# Patient Record
Sex: Female | Born: 1945 | Race: White | Hispanic: No | Marital: Married | Smoking: Former smoker
Health system: Southern US, Community
[De-identification: ages and names within clinical notes are randomized; demographics above are authoritative.]

## PROBLEM LIST (undated history)

## (undated) DIAGNOSIS — K219 Gastro-esophageal reflux disease without esophagitis: Secondary | ICD-10-CM

## (undated) DIAGNOSIS — I4510 Unspecified right bundle-branch block: Secondary | ICD-10-CM

## (undated) DIAGNOSIS — R413 Other amnesia: Secondary | ICD-10-CM

## (undated) DIAGNOSIS — R609 Edema, unspecified: Secondary | ICD-10-CM

## (undated) DIAGNOSIS — I10 Essential (primary) hypertension: Secondary | ICD-10-CM

## (undated) HISTORY — PX: TUBAL LIGATION: SHX77

## (undated) HISTORY — DX: Gastro-esophageal reflux disease without esophagitis: K21.9

## (undated) HISTORY — DX: Other amnesia: R41.3

## (undated) HISTORY — DX: Essential (primary) hypertension: I10

## (undated) HISTORY — PX: OTHER SURGICAL HISTORY: SHX169

## (undated) HISTORY — DX: Edema, unspecified: R60.9

## (undated) HISTORY — DX: Unspecified right bundle-branch block: I45.10

---

## 2012-12-03 DIAGNOSIS — L509 Urticaria, unspecified: Secondary | ICD-10-CM

## 2012-12-03 HISTORY — DX: Urticaria, unspecified: L50.9

## 2014-09-28 DIAGNOSIS — E119 Type 2 diabetes mellitus without complications: Secondary | ICD-10-CM | POA: Diagnosis not present

## 2014-09-28 DIAGNOSIS — H524 Presbyopia: Secondary | ICD-10-CM | POA: Diagnosis not present

## 2014-09-28 DIAGNOSIS — E11359 Type 2 diabetes mellitus with proliferative diabetic retinopathy without macular edema: Secondary | ICD-10-CM | POA: Diagnosis not present

## 2014-11-22 DIAGNOSIS — E119 Type 2 diabetes mellitus without complications: Secondary | ICD-10-CM | POA: Diagnosis not present

## 2014-12-18 DIAGNOSIS — E119 Type 2 diabetes mellitus without complications: Secondary | ICD-10-CM | POA: Diagnosis not present

## 2014-12-25 DIAGNOSIS — Z1389 Encounter for screening for other disorder: Secondary | ICD-10-CM | POA: Diagnosis not present

## 2014-12-25 DIAGNOSIS — Z0000 Encounter for general adult medical examination without abnormal findings: Secondary | ICD-10-CM | POA: Diagnosis not present

## 2014-12-25 DIAGNOSIS — E119 Type 2 diabetes mellitus without complications: Secondary | ICD-10-CM | POA: Diagnosis not present

## 2014-12-25 DIAGNOSIS — Z9181 History of falling: Secondary | ICD-10-CM | POA: Diagnosis not present

## 2014-12-25 DIAGNOSIS — Z23 Encounter for immunization: Secondary | ICD-10-CM | POA: Diagnosis not present

## 2015-01-08 DIAGNOSIS — M5116 Intervertebral disc disorders with radiculopathy, lumbar region: Secondary | ICD-10-CM | POA: Diagnosis not present

## 2015-01-26 DIAGNOSIS — R5382 Chronic fatigue, unspecified: Secondary | ICD-10-CM | POA: Diagnosis not present

## 2015-01-26 DIAGNOSIS — E780 Pure hypercholesterolemia: Secondary | ICD-10-CM | POA: Diagnosis not present

## 2015-01-26 DIAGNOSIS — E569 Vitamin deficiency, unspecified: Secondary | ICD-10-CM | POA: Diagnosis not present

## 2015-01-26 DIAGNOSIS — R609 Edema, unspecified: Secondary | ICD-10-CM | POA: Diagnosis not present

## 2015-02-25 DIAGNOSIS — E780 Pure hypercholesterolemia: Secondary | ICD-10-CM | POA: Diagnosis not present

## 2015-02-25 DIAGNOSIS — E119 Type 2 diabetes mellitus without complications: Secondary | ICD-10-CM | POA: Diagnosis not present

## 2015-02-25 DIAGNOSIS — E569 Vitamin deficiency, unspecified: Secondary | ICD-10-CM | POA: Diagnosis not present

## 2015-02-25 DIAGNOSIS — I10 Essential (primary) hypertension: Secondary | ICD-10-CM | POA: Diagnosis not present

## 2015-03-19 DIAGNOSIS — E119 Type 2 diabetes mellitus without complications: Secondary | ICD-10-CM | POA: Diagnosis not present

## 2015-03-26 DIAGNOSIS — E119 Type 2 diabetes mellitus without complications: Secondary | ICD-10-CM | POA: Diagnosis not present

## 2015-04-20 DIAGNOSIS — Z23 Encounter for immunization: Secondary | ICD-10-CM | POA: Diagnosis not present

## 2015-07-06 DIAGNOSIS — Z1231 Encounter for screening mammogram for malignant neoplasm of breast: Secondary | ICD-10-CM | POA: Diagnosis not present

## 2015-09-13 DIAGNOSIS — E782 Mixed hyperlipidemia: Secondary | ICD-10-CM | POA: Diagnosis not present

## 2015-09-13 DIAGNOSIS — E119 Type 2 diabetes mellitus without complications: Secondary | ICD-10-CM | POA: Diagnosis not present

## 2015-09-20 DIAGNOSIS — D510 Vitamin B12 deficiency anemia due to intrinsic factor deficiency: Secondary | ICD-10-CM | POA: Diagnosis not present

## 2015-09-20 DIAGNOSIS — M859 Disorder of bone density and structure, unspecified: Secondary | ICD-10-CM | POA: Diagnosis not present

## 2015-09-20 DIAGNOSIS — E559 Vitamin D deficiency, unspecified: Secondary | ICD-10-CM | POA: Diagnosis not present

## 2015-09-20 DIAGNOSIS — D649 Anemia, unspecified: Secondary | ICD-10-CM | POA: Diagnosis not present

## 2015-09-20 DIAGNOSIS — E119 Type 2 diabetes mellitus without complications: Secondary | ICD-10-CM | POA: Diagnosis not present

## 2015-10-01 DIAGNOSIS — E119 Type 2 diabetes mellitus without complications: Secondary | ICD-10-CM | POA: Diagnosis not present

## 2015-10-01 DIAGNOSIS — Z961 Presence of intraocular lens: Secondary | ICD-10-CM | POA: Diagnosis not present

## 2016-03-12 DIAGNOSIS — E119 Type 2 diabetes mellitus without complications: Secondary | ICD-10-CM | POA: Diagnosis not present

## 2016-03-19 DIAGNOSIS — R0609 Other forms of dyspnea: Secondary | ICD-10-CM | POA: Diagnosis not present

## 2016-03-19 DIAGNOSIS — Z9181 History of falling: Secondary | ICD-10-CM | POA: Diagnosis not present

## 2016-03-19 DIAGNOSIS — Z0000 Encounter for general adult medical examination without abnormal findings: Secondary | ICD-10-CM | POA: Diagnosis not present

## 2016-03-19 DIAGNOSIS — E119 Type 2 diabetes mellitus without complications: Secondary | ICD-10-CM | POA: Diagnosis not present

## 2016-03-19 DIAGNOSIS — Z1389 Encounter for screening for other disorder: Secondary | ICD-10-CM | POA: Diagnosis not present

## 2016-04-10 DIAGNOSIS — R0609 Other forms of dyspnea: Secondary | ICD-10-CM | POA: Diagnosis not present

## 2016-04-10 DIAGNOSIS — R0602 Shortness of breath: Secondary | ICD-10-CM | POA: Diagnosis not present

## 2016-04-16 DIAGNOSIS — Z23 Encounter for immunization: Secondary | ICD-10-CM | POA: Diagnosis not present

## 2016-04-16 DIAGNOSIS — Z0000 Encounter for general adult medical examination without abnormal findings: Secondary | ICD-10-CM | POA: Diagnosis not present

## 2016-06-09 DIAGNOSIS — L03116 Cellulitis of left lower limb: Secondary | ICD-10-CM | POA: Diagnosis not present

## 2016-06-09 DIAGNOSIS — L03031 Cellulitis of right toe: Secondary | ICD-10-CM | POA: Diagnosis not present

## 2016-07-30 DIAGNOSIS — L03031 Cellulitis of right toe: Secondary | ICD-10-CM | POA: Diagnosis not present

## 2016-07-30 DIAGNOSIS — M109 Gout, unspecified: Secondary | ICD-10-CM | POA: Diagnosis not present

## 2016-09-10 DIAGNOSIS — E119 Type 2 diabetes mellitus without complications: Secondary | ICD-10-CM | POA: Diagnosis not present

## 2016-09-17 DIAGNOSIS — E119 Type 2 diabetes mellitus without complications: Secondary | ICD-10-CM | POA: Diagnosis not present

## 2016-09-17 DIAGNOSIS — M109 Gout, unspecified: Secondary | ICD-10-CM | POA: Diagnosis not present

## 2016-09-17 DIAGNOSIS — N183 Chronic kidney disease, stage 3 (moderate): Secondary | ICD-10-CM | POA: Diagnosis not present

## 2016-09-22 DIAGNOSIS — Z1231 Encounter for screening mammogram for malignant neoplasm of breast: Secondary | ICD-10-CM | POA: Diagnosis not present

## 2016-10-01 DIAGNOSIS — E119 Type 2 diabetes mellitus without complications: Secondary | ICD-10-CM | POA: Diagnosis not present

## 2017-01-05 DIAGNOSIS — Z79899 Other long term (current) drug therapy: Secondary | ICD-10-CM | POA: Diagnosis not present

## 2017-03-17 DIAGNOSIS — E119 Type 2 diabetes mellitus without complications: Secondary | ICD-10-CM | POA: Diagnosis not present

## 2017-03-25 DIAGNOSIS — Z0000 Encounter for general adult medical examination without abnormal findings: Secondary | ICD-10-CM | POA: Diagnosis not present

## 2017-03-25 DIAGNOSIS — E119 Type 2 diabetes mellitus without complications: Secondary | ICD-10-CM | POA: Diagnosis not present

## 2017-03-25 DIAGNOSIS — Z6832 Body mass index (BMI) 32.0-32.9, adult: Secondary | ICD-10-CM | POA: Diagnosis not present

## 2017-03-25 DIAGNOSIS — Z9181 History of falling: Secondary | ICD-10-CM | POA: Diagnosis not present

## 2017-04-22 DIAGNOSIS — Z23 Encounter for immunization: Secondary | ICD-10-CM | POA: Diagnosis not present

## 2017-09-18 DIAGNOSIS — E119 Type 2 diabetes mellitus without complications: Secondary | ICD-10-CM | POA: Diagnosis not present

## 2017-09-18 DIAGNOSIS — E782 Mixed hyperlipidemia: Secondary | ICD-10-CM | POA: Diagnosis not present

## 2017-09-25 DIAGNOSIS — M25512 Pain in left shoulder: Secondary | ICD-10-CM | POA: Diagnosis not present

## 2017-09-25 DIAGNOSIS — Z1331 Encounter for screening for depression: Secondary | ICD-10-CM | POA: Diagnosis not present

## 2017-09-25 DIAGNOSIS — E119 Type 2 diabetes mellitus without complications: Secondary | ICD-10-CM | POA: Diagnosis not present

## 2017-09-25 DIAGNOSIS — Z6833 Body mass index (BMI) 33.0-33.9, adult: Secondary | ICD-10-CM | POA: Diagnosis not present

## 2017-09-30 DIAGNOSIS — M75102 Unspecified rotator cuff tear or rupture of left shoulder, not specified as traumatic: Secondary | ICD-10-CM | POA: Diagnosis not present

## 2017-10-13 DIAGNOSIS — E119 Type 2 diabetes mellitus without complications: Secondary | ICD-10-CM | POA: Diagnosis not present

## 2017-10-20 DIAGNOSIS — Z1231 Encounter for screening mammogram for malignant neoplasm of breast: Secondary | ICD-10-CM | POA: Diagnosis not present

## 2018-02-08 DIAGNOSIS — Z6832 Body mass index (BMI) 32.0-32.9, adult: Secondary | ICD-10-CM | POA: Diagnosis not present

## 2018-02-08 DIAGNOSIS — R300 Dysuria: Secondary | ICD-10-CM | POA: Diagnosis not present

## 2018-03-19 DIAGNOSIS — E1165 Type 2 diabetes mellitus with hyperglycemia: Secondary | ICD-10-CM | POA: Diagnosis not present

## 2018-03-26 DIAGNOSIS — Z6832 Body mass index (BMI) 32.0-32.9, adult: Secondary | ICD-10-CM | POA: Diagnosis not present

## 2018-03-26 DIAGNOSIS — R350 Frequency of micturition: Secondary | ICD-10-CM | POA: Diagnosis not present

## 2018-03-26 DIAGNOSIS — R0989 Other specified symptoms and signs involving the circulatory and respiratory systems: Secondary | ICD-10-CM | POA: Diagnosis not present

## 2018-03-26 DIAGNOSIS — E118 Type 2 diabetes mellitus with unspecified complications: Secondary | ICD-10-CM | POA: Diagnosis not present

## 2018-04-23 DIAGNOSIS — Z1339 Encounter for screening examination for other mental health and behavioral disorders: Secondary | ICD-10-CM | POA: Diagnosis not present

## 2018-04-23 DIAGNOSIS — Z6833 Body mass index (BMI) 33.0-33.9, adult: Secondary | ICD-10-CM | POA: Diagnosis not present

## 2018-04-23 DIAGNOSIS — Z9181 History of falling: Secondary | ICD-10-CM | POA: Diagnosis not present

## 2018-04-23 DIAGNOSIS — Z23 Encounter for immunization: Secondary | ICD-10-CM | POA: Diagnosis not present

## 2018-04-23 DIAGNOSIS — M79672 Pain in left foot: Secondary | ICD-10-CM | POA: Diagnosis not present

## 2018-05-19 DIAGNOSIS — Z79899 Other long term (current) drug therapy: Secondary | ICD-10-CM | POA: Diagnosis not present

## 2018-06-02 DIAGNOSIS — Z6833 Body mass index (BMI) 33.0-33.9, adult: Secondary | ICD-10-CM | POA: Diagnosis not present

## 2018-06-02 DIAGNOSIS — J40 Bronchitis, not specified as acute or chronic: Secondary | ICD-10-CM | POA: Diagnosis not present

## 2018-06-02 DIAGNOSIS — J329 Chronic sinusitis, unspecified: Secondary | ICD-10-CM | POA: Diagnosis not present

## 2018-09-01 DIAGNOSIS — E118 Type 2 diabetes mellitus with unspecified complications: Secondary | ICD-10-CM | POA: Diagnosis not present

## 2018-09-28 DIAGNOSIS — I10 Essential (primary) hypertension: Secondary | ICD-10-CM | POA: Diagnosis not present

## 2018-09-28 DIAGNOSIS — E118 Type 2 diabetes mellitus with unspecified complications: Secondary | ICD-10-CM | POA: Diagnosis not present

## 2018-09-28 DIAGNOSIS — Z1331 Encounter for screening for depression: Secondary | ICD-10-CM | POA: Diagnosis not present

## 2018-09-28 DIAGNOSIS — Z6833 Body mass index (BMI) 33.0-33.9, adult: Secondary | ICD-10-CM | POA: Diagnosis not present

## 2019-03-21 DIAGNOSIS — E119 Type 2 diabetes mellitus without complications: Secondary | ICD-10-CM | POA: Diagnosis not present

## 2019-03-21 DIAGNOSIS — E782 Mixed hyperlipidemia: Secondary | ICD-10-CM | POA: Diagnosis not present

## 2019-03-28 DIAGNOSIS — E782 Mixed hyperlipidemia: Secondary | ICD-10-CM | POA: Diagnosis not present

## 2019-03-28 DIAGNOSIS — I10 Essential (primary) hypertension: Secondary | ICD-10-CM | POA: Diagnosis not present

## 2019-03-28 DIAGNOSIS — Z13820 Encounter for screening for osteoporosis: Secondary | ICD-10-CM | POA: Diagnosis not present

## 2019-03-28 DIAGNOSIS — R350 Frequency of micturition: Secondary | ICD-10-CM | POA: Diagnosis not present

## 2019-03-28 DIAGNOSIS — E119 Type 2 diabetes mellitus without complications: Secondary | ICD-10-CM | POA: Diagnosis not present

## 2019-03-28 DIAGNOSIS — Z0000 Encounter for general adult medical examination without abnormal findings: Secondary | ICD-10-CM | POA: Diagnosis not present

## 2019-03-28 DIAGNOSIS — E669 Obesity, unspecified: Secondary | ICD-10-CM | POA: Diagnosis not present

## 2019-03-28 DIAGNOSIS — N183 Chronic kidney disease, stage 3 (moderate): Secondary | ICD-10-CM | POA: Diagnosis not present

## 2019-03-28 DIAGNOSIS — M810 Age-related osteoporosis without current pathological fracture: Secondary | ICD-10-CM | POA: Diagnosis not present

## 2019-04-14 DIAGNOSIS — Z23 Encounter for immunization: Secondary | ICD-10-CM | POA: Diagnosis not present

## 2019-04-14 DIAGNOSIS — E669 Obesity, unspecified: Secondary | ICD-10-CM | POA: Diagnosis not present

## 2019-04-14 DIAGNOSIS — E119 Type 2 diabetes mellitus without complications: Secondary | ICD-10-CM | POA: Diagnosis not present

## 2019-04-14 DIAGNOSIS — Z6833 Body mass index (BMI) 33.0-33.9, adult: Secondary | ICD-10-CM | POA: Diagnosis not present

## 2019-04-14 DIAGNOSIS — N3000 Acute cystitis without hematuria: Secondary | ICD-10-CM | POA: Diagnosis not present

## 2019-07-11 DIAGNOSIS — E119 Type 2 diabetes mellitus without complications: Secondary | ICD-10-CM | POA: Diagnosis not present

## 2019-07-14 DIAGNOSIS — Z9181 History of falling: Secondary | ICD-10-CM | POA: Diagnosis not present

## 2019-07-14 DIAGNOSIS — I10 Essential (primary) hypertension: Secondary | ICD-10-CM | POA: Diagnosis not present

## 2019-07-14 DIAGNOSIS — E119 Type 2 diabetes mellitus without complications: Secondary | ICD-10-CM | POA: Diagnosis not present

## 2019-07-14 DIAGNOSIS — Z6834 Body mass index (BMI) 34.0-34.9, adult: Secondary | ICD-10-CM | POA: Diagnosis not present

## 2019-07-14 DIAGNOSIS — M85859 Other specified disorders of bone density and structure, unspecified thigh: Secondary | ICD-10-CM | POA: Diagnosis not present

## 2019-07-14 DIAGNOSIS — E669 Obesity, unspecified: Secondary | ICD-10-CM | POA: Diagnosis not present

## 2019-07-21 DIAGNOSIS — E119 Type 2 diabetes mellitus without complications: Secondary | ICD-10-CM | POA: Diagnosis not present

## 2019-07-21 DIAGNOSIS — Z135 Encounter for screening for eye and ear disorders: Secondary | ICD-10-CM | POA: Diagnosis not present

## 2019-07-21 DIAGNOSIS — Z79899 Other long term (current) drug therapy: Secondary | ICD-10-CM | POA: Diagnosis not present

## 2019-07-21 DIAGNOSIS — Z961 Presence of intraocular lens: Secondary | ICD-10-CM | POA: Diagnosis not present

## 2019-07-21 DIAGNOSIS — H524 Presbyopia: Secondary | ICD-10-CM | POA: Diagnosis not present

## 2019-07-21 DIAGNOSIS — Z0100 Encounter for examination of eyes and vision without abnormal findings: Secondary | ICD-10-CM | POA: Diagnosis not present

## 2019-12-27 DIAGNOSIS — M25475 Effusion, left foot: Secondary | ICD-10-CM | POA: Diagnosis not present

## 2019-12-27 DIAGNOSIS — E119 Type 2 diabetes mellitus without complications: Secondary | ICD-10-CM | POA: Diagnosis not present

## 2019-12-27 DIAGNOSIS — Z6834 Body mass index (BMI) 34.0-34.9, adult: Secondary | ICD-10-CM | POA: Diagnosis not present

## 2019-12-27 DIAGNOSIS — M109 Gout, unspecified: Secondary | ICD-10-CM | POA: Diagnosis not present

## 2019-12-27 DIAGNOSIS — M7989 Other specified soft tissue disorders: Secondary | ICD-10-CM | POA: Diagnosis not present

## 2020-01-05 DIAGNOSIS — E782 Mixed hyperlipidemia: Secondary | ICD-10-CM | POA: Diagnosis not present

## 2020-01-12 DIAGNOSIS — Z6833 Body mass index (BMI) 33.0-33.9, adult: Secondary | ICD-10-CM | POA: Diagnosis not present

## 2020-01-12 DIAGNOSIS — E669 Obesity, unspecified: Secondary | ICD-10-CM | POA: Diagnosis not present

## 2020-01-12 DIAGNOSIS — E1169 Type 2 diabetes mellitus with other specified complication: Secondary | ICD-10-CM | POA: Diagnosis not present

## 2020-01-12 DIAGNOSIS — M109 Gout, unspecified: Secondary | ICD-10-CM | POA: Diagnosis not present

## 2020-01-12 DIAGNOSIS — E118 Type 2 diabetes mellitus with unspecified complications: Secondary | ICD-10-CM | POA: Diagnosis not present

## 2020-03-30 DIAGNOSIS — E1169 Type 2 diabetes mellitus with other specified complication: Secondary | ICD-10-CM | POA: Diagnosis not present

## 2020-03-30 DIAGNOSIS — Z1331 Encounter for screening for depression: Secondary | ICD-10-CM | POA: Diagnosis not present

## 2020-03-30 DIAGNOSIS — E118 Type 2 diabetes mellitus with unspecified complications: Secondary | ICD-10-CM | POA: Diagnosis not present

## 2020-03-30 DIAGNOSIS — Z6833 Body mass index (BMI) 33.0-33.9, adult: Secondary | ICD-10-CM | POA: Diagnosis not present

## 2020-03-30 DIAGNOSIS — E782 Mixed hyperlipidemia: Secondary | ICD-10-CM | POA: Diagnosis not present

## 2020-03-30 DIAGNOSIS — Z0000 Encounter for general adult medical examination without abnormal findings: Secondary | ICD-10-CM | POA: Diagnosis not present

## 2020-03-30 DIAGNOSIS — Z79899 Other long term (current) drug therapy: Secondary | ICD-10-CM | POA: Diagnosis not present

## 2020-03-30 DIAGNOSIS — I10 Essential (primary) hypertension: Secondary | ICD-10-CM | POA: Diagnosis not present

## 2020-04-13 DIAGNOSIS — Z1231 Encounter for screening mammogram for malignant neoplasm of breast: Secondary | ICD-10-CM | POA: Diagnosis not present

## 2020-05-10 DIAGNOSIS — Z23 Encounter for immunization: Secondary | ICD-10-CM | POA: Diagnosis not present

## 2020-08-14 DIAGNOSIS — Z23 Encounter for immunization: Secondary | ICD-10-CM | POA: Diagnosis not present

## 2020-09-24 DIAGNOSIS — E118 Type 2 diabetes mellitus with unspecified complications: Secondary | ICD-10-CM | POA: Diagnosis not present

## 2020-10-01 DIAGNOSIS — I10 Essential (primary) hypertension: Secondary | ICD-10-CM | POA: Diagnosis not present

## 2020-10-01 DIAGNOSIS — Z9181 History of falling: Secondary | ICD-10-CM | POA: Diagnosis not present

## 2020-10-01 DIAGNOSIS — E785 Hyperlipidemia, unspecified: Secondary | ICD-10-CM | POA: Diagnosis not present

## 2020-10-01 DIAGNOSIS — Z6833 Body mass index (BMI) 33.0-33.9, adult: Secondary | ICD-10-CM | POA: Diagnosis not present

## 2020-10-01 DIAGNOSIS — H65192 Other acute nonsuppurative otitis media, left ear: Secondary | ICD-10-CM | POA: Diagnosis not present

## 2020-10-01 DIAGNOSIS — N183 Chronic kidney disease, stage 3 unspecified: Secondary | ICD-10-CM | POA: Diagnosis not present

## 2020-10-01 DIAGNOSIS — E118 Type 2 diabetes mellitus with unspecified complications: Secondary | ICD-10-CM | POA: Diagnosis not present

## 2020-10-01 DIAGNOSIS — Z1331 Encounter for screening for depression: Secondary | ICD-10-CM | POA: Diagnosis not present

## 2020-10-26 DIAGNOSIS — Z0100 Encounter for examination of eyes and vision without abnormal findings: Secondary | ICD-10-CM | POA: Diagnosis not present

## 2020-10-26 DIAGNOSIS — Z135 Encounter for screening for eye and ear disorders: Secondary | ICD-10-CM | POA: Diagnosis not present

## 2020-10-26 DIAGNOSIS — E119 Type 2 diabetes mellitus without complications: Secondary | ICD-10-CM | POA: Diagnosis not present

## 2020-10-26 DIAGNOSIS — Z961 Presence of intraocular lens: Secondary | ICD-10-CM | POA: Diagnosis not present

## 2020-12-20 DIAGNOSIS — H18413 Arcus senilis, bilateral: Secondary | ICD-10-CM | POA: Diagnosis not present

## 2020-12-20 DIAGNOSIS — H26492 Other secondary cataract, left eye: Secondary | ICD-10-CM | POA: Diagnosis not present

## 2020-12-20 DIAGNOSIS — H02831 Dermatochalasis of right upper eyelid: Secondary | ICD-10-CM | POA: Diagnosis not present

## 2020-12-20 DIAGNOSIS — Z961 Presence of intraocular lens: Secondary | ICD-10-CM | POA: Diagnosis not present

## 2021-03-25 DIAGNOSIS — Z0000 Encounter for general adult medical examination without abnormal findings: Secondary | ICD-10-CM | POA: Diagnosis not present

## 2021-03-25 DIAGNOSIS — E785 Hyperlipidemia, unspecified: Secondary | ICD-10-CM | POA: Diagnosis not present

## 2021-04-01 DIAGNOSIS — Z79899 Other long term (current) drug therapy: Secondary | ICD-10-CM | POA: Diagnosis not present

## 2021-04-01 DIAGNOSIS — Z13820 Encounter for screening for osteoporosis: Secondary | ICD-10-CM | POA: Diagnosis not present

## 2021-04-01 DIAGNOSIS — D692 Other nonthrombocytopenic purpura: Secondary | ICD-10-CM | POA: Diagnosis not present

## 2021-04-01 DIAGNOSIS — N183 Chronic kidney disease, stage 3 unspecified: Secondary | ICD-10-CM | POA: Diagnosis not present

## 2021-04-01 DIAGNOSIS — Z6832 Body mass index (BMI) 32.0-32.9, adult: Secondary | ICD-10-CM | POA: Diagnosis not present

## 2021-04-01 DIAGNOSIS — E118 Type 2 diabetes mellitus with unspecified complications: Secondary | ICD-10-CM | POA: Diagnosis not present

## 2021-04-01 DIAGNOSIS — I10 Essential (primary) hypertension: Secondary | ICD-10-CM | POA: Diagnosis not present

## 2021-04-01 DIAGNOSIS — Z0000 Encounter for general adult medical examination without abnormal findings: Secondary | ICD-10-CM | POA: Diagnosis not present

## 2021-04-19 DIAGNOSIS — Z23 Encounter for immunization: Secondary | ICD-10-CM | POA: Diagnosis not present

## 2021-05-21 DIAGNOSIS — Z1231 Encounter for screening mammogram for malignant neoplasm of breast: Secondary | ICD-10-CM | POA: Diagnosis not present

## 2021-10-01 DIAGNOSIS — S8992XA Unspecified injury of left lower leg, initial encounter: Secondary | ICD-10-CM | POA: Diagnosis not present

## 2021-10-01 DIAGNOSIS — Z6834 Body mass index (BMI) 34.0-34.9, adult: Secondary | ICD-10-CM | POA: Diagnosis not present

## 2021-10-01 DIAGNOSIS — S4992XA Unspecified injury of left shoulder and upper arm, initial encounter: Secondary | ICD-10-CM | POA: Diagnosis not present

## 2021-10-01 DIAGNOSIS — E118 Type 2 diabetes mellitus with unspecified complications: Secondary | ICD-10-CM | POA: Diagnosis not present

## 2021-10-01 DIAGNOSIS — S6992XA Unspecified injury of left wrist, hand and finger(s), initial encounter: Secondary | ICD-10-CM | POA: Diagnosis not present

## 2021-10-01 DIAGNOSIS — D692 Other nonthrombocytopenic purpura: Secondary | ICD-10-CM | POA: Diagnosis not present

## 2021-10-01 DIAGNOSIS — N183 Chronic kidney disease, stage 3 unspecified: Secondary | ICD-10-CM | POA: Diagnosis not present

## 2021-10-01 DIAGNOSIS — I10 Essential (primary) hypertension: Secondary | ICD-10-CM | POA: Diagnosis not present

## 2021-10-11 ENCOUNTER — Ambulatory Visit (HOSPITAL_BASED_OUTPATIENT_CLINIC_OR_DEPARTMENT_OTHER)
Admission: RE | Admit: 2021-10-11 | Discharge: 2021-10-11 | Disposition: A | Discharge status: Home / Self Care | Payer: Medicare HMO | Source: Ambulatory Visit | Attending: Family Medicine | Admitting: Family Medicine

## 2021-10-11 ENCOUNTER — Other Ambulatory Visit (HOSPITAL_BASED_OUTPATIENT_CLINIC_OR_DEPARTMENT_OTHER): Payer: Self-pay | Admitting: Family Medicine

## 2021-10-11 ENCOUNTER — Other Ambulatory Visit: Payer: Self-pay

## 2021-10-11 DIAGNOSIS — K8020 Calculus of gallbladder without cholecystitis without obstruction: Secondary | ICD-10-CM | POA: Diagnosis not present

## 2021-10-11 DIAGNOSIS — R1011 Right upper quadrant pain: Secondary | ICD-10-CM

## 2021-10-11 DIAGNOSIS — Z6834 Body mass index (BMI) 34.0-34.9, adult: Secondary | ICD-10-CM | POA: Diagnosis not present

## 2021-10-11 DIAGNOSIS — K838 Other specified diseases of biliary tract: Secondary | ICD-10-CM | POA: Diagnosis not present

## 2021-10-14 DIAGNOSIS — K8010 Calculus of gallbladder with chronic cholecystitis without obstruction: Secondary | ICD-10-CM

## 2021-10-14 DIAGNOSIS — E118 Type 2 diabetes mellitus with unspecified complications: Secondary | ICD-10-CM | POA: Diagnosis not present

## 2021-10-14 DIAGNOSIS — Z6833 Body mass index (BMI) 33.0-33.9, adult: Secondary | ICD-10-CM

## 2021-10-14 DIAGNOSIS — R1011 Right upper quadrant pain: Secondary | ICD-10-CM | POA: Diagnosis not present

## 2021-10-14 DIAGNOSIS — Z1331 Encounter for screening for depression: Secondary | ICD-10-CM | POA: Diagnosis not present

## 2021-10-14 HISTORY — DX: Calculus of gallbladder with chronic cholecystitis without obstruction: K80.10

## 2021-10-14 HISTORY — DX: Body mass index (BMI) 33.0-33.9, adult: Z68.33

## 2021-10-16 DIAGNOSIS — I10 Essential (primary) hypertension: Secondary | ICD-10-CM | POA: Diagnosis not present

## 2021-10-16 DIAGNOSIS — E7800 Pure hypercholesterolemia, unspecified: Secondary | ICD-10-CM | POA: Diagnosis not present

## 2021-10-16 DIAGNOSIS — E119 Type 2 diabetes mellitus without complications: Secondary | ICD-10-CM | POA: Diagnosis not present

## 2021-10-16 DIAGNOSIS — K8010 Calculus of gallbladder with chronic cholecystitis without obstruction: Secondary | ICD-10-CM | POA: Diagnosis not present

## 2021-10-16 DIAGNOSIS — Z87891 Personal history of nicotine dependence: Secondary | ICD-10-CM | POA: Diagnosis not present

## 2021-10-16 DIAGNOSIS — Z7984 Long term (current) use of oral hypoglycemic drugs: Secondary | ICD-10-CM | POA: Diagnosis not present

## 2021-10-16 DIAGNOSIS — K219 Gastro-esophageal reflux disease without esophagitis: Secondary | ICD-10-CM | POA: Diagnosis not present

## 2021-10-16 DIAGNOSIS — K8020 Calculus of gallbladder without cholecystitis without obstruction: Secondary | ICD-10-CM | POA: Diagnosis not present

## 2021-10-16 HISTORY — PX: CHOLECYSTECTOMY: SHX55

## 2021-10-23 DIAGNOSIS — G8929 Other chronic pain: Secondary | ICD-10-CM | POA: Diagnosis not present

## 2021-10-23 DIAGNOSIS — Z9049 Acquired absence of other specified parts of digestive tract: Secondary | ICD-10-CM | POA: Diagnosis not present

## 2021-10-23 DIAGNOSIS — Z6833 Body mass index (BMI) 33.0-33.9, adult: Secondary | ICD-10-CM | POA: Diagnosis not present

## 2021-10-23 DIAGNOSIS — M25532 Pain in left wrist: Secondary | ICD-10-CM | POA: Diagnosis not present

## 2021-11-22 DIAGNOSIS — Z9049 Acquired absence of other specified parts of digestive tract: Secondary | ICD-10-CM | POA: Diagnosis not present

## 2021-11-22 DIAGNOSIS — G8929 Other chronic pain: Secondary | ICD-10-CM | POA: Diagnosis not present

## 2021-11-22 DIAGNOSIS — R0789 Other chest pain: Secondary | ICD-10-CM | POA: Diagnosis not present

## 2021-11-22 DIAGNOSIS — Z6833 Body mass index (BMI) 33.0-33.9, adult: Secondary | ICD-10-CM | POA: Diagnosis not present

## 2021-11-22 DIAGNOSIS — K219 Gastro-esophageal reflux disease without esophagitis: Secondary | ICD-10-CM | POA: Diagnosis not present

## 2021-11-22 DIAGNOSIS — M25532 Pain in left wrist: Secondary | ICD-10-CM | POA: Diagnosis not present

## 2021-11-22 DIAGNOSIS — I4510 Unspecified right bundle-branch block: Secondary | ICD-10-CM | POA: Diagnosis not present

## 2021-11-26 ENCOUNTER — Encounter: Payer: Self-pay | Admitting: *Deleted

## 2021-11-26 ENCOUNTER — Encounter: Payer: Self-pay | Admitting: Cardiology

## 2021-11-26 DIAGNOSIS — R609 Edema, unspecified: Secondary | ICD-10-CM | POA: Insufficient documentation

## 2021-11-26 DIAGNOSIS — E119 Type 2 diabetes mellitus without complications: Secondary | ICD-10-CM | POA: Insufficient documentation

## 2021-11-26 DIAGNOSIS — K219 Gastro-esophageal reflux disease without esophagitis: Secondary | ICD-10-CM | POA: Insufficient documentation

## 2021-11-26 DIAGNOSIS — I4510 Unspecified right bundle-branch block: Secondary | ICD-10-CM | POA: Insufficient documentation

## 2021-11-26 DIAGNOSIS — R413 Other amnesia: Secondary | ICD-10-CM | POA: Insufficient documentation

## 2021-11-26 DIAGNOSIS — I10 Essential (primary) hypertension: Secondary | ICD-10-CM | POA: Insufficient documentation

## 2021-11-26 HISTORY — DX: Type 2 diabetes mellitus without complications: E11.9

## 2021-11-27 ENCOUNTER — Ambulatory Visit: Payer: Medicare HMO | Admitting: Cardiology

## 2021-11-27 ENCOUNTER — Encounter: Payer: Self-pay | Admitting: Cardiology

## 2021-11-27 VITALS — BP 207/72 | HR 71 | Ht 63.6 in | Wt 189.0 lb

## 2021-11-27 DIAGNOSIS — I4510 Unspecified right bundle-branch block: Secondary | ICD-10-CM | POA: Diagnosis not present

## 2021-11-27 DIAGNOSIS — E088 Diabetes mellitus due to underlying condition with unspecified complications: Secondary | ICD-10-CM

## 2021-11-27 DIAGNOSIS — R079 Chest pain, unspecified: Secondary | ICD-10-CM

## 2021-11-27 DIAGNOSIS — E669 Obesity, unspecified: Secondary | ICD-10-CM | POA: Insufficient documentation

## 2021-11-27 DIAGNOSIS — E66811 Obesity, class 1: Secondary | ICD-10-CM

## 2021-11-27 DIAGNOSIS — I10 Essential (primary) hypertension: Secondary | ICD-10-CM | POA: Diagnosis not present

## 2021-11-27 HISTORY — DX: Obesity, unspecified: E66.9

## 2021-11-27 HISTORY — DX: Chest pain, unspecified: R07.9

## 2021-11-27 HISTORY — DX: Diabetes mellitus due to underlying condition with unspecified complications: E08.8

## 2021-11-27 HISTORY — DX: Obesity, class 1: E66.811

## 2021-11-27 NOTE — Progress Notes (Signed)
?Cardiology Office Note:   ? ?Date:  11/27/2021  ? ?IDYen Austin, DOB [birth year], MRN [id number] ? ?PCP:  Buckner Malta, MD  ?Cardiologist:  Garwin Brothers, MD  ? ?Referring MD: Buckner Malta, MD  ? ? ?ASSESSMENT:   ? ?1. RBBB (right bundle branch block)   ?2. Primary hypertension   ?3. Diabetes mellitus due to underlying condition with unspecified complications (HCC)   ?4. Chest pain of uncertain etiology   ? ?PLAN:   ? ?In order of problems listed above: ? ?Primary prevention stressed with the patient.  Importance of compliance with diet and medication stressed and she vocalized understanding. ?Chest pain: Unclear etiology.  Overall she leads a sedentary lifestyle.  She is agreeable for an exercise stress Cardiolite.  She has multiple risk factors for coronary artery disease. ?Cardiac murmur: Echocardiogram will be done to assess murmur heard on auscultation. ?Essential hypertension: Patient has an element of whitecoat hypertension.  She checks her blood pressure at home and mentioned to me good numbers that were reliable.  She was advised to continue monitoring blood pressure at home and bring those along the next time she comes to see [place]. ?Obesity: Weight reduction stressed diet emphasized and she promises to do better. ?Patient will be seen in follow-up appointment in 6 months or earlier if the patient has any concerns.  She knows to go to the nearest emergency room for any concerning symptoms. ? ? ?Medication Adjustments/Labs and Tests Ordered: ?Current medicines are reviewed at length with the patient today.  Concerns regarding medicines are outlined above.  ?No orders of the defined types were placed in this encounter. ? ?No orders of the defined types were placed in this encounter. ? ? ? ?History of Present Illness:   ? ?Jacqueline Austin is a 76 y.o. female who is being seen today for the evaluation of chest pain at the request of Buckner Malta, MD. patient is a pleasant 76 year old female.   She has past medical history of essential hypertension, diabetes mellitus and obesity.  Overall she states that she is in good health.  She mentions to me that she has some chest discomfort on exertion.  Occasionally it radiates to the back.  This occurs on exertion.  No orthopnea or PND.  At the time of my evaluation, the patient is alert awake oriented and in no distress. ? ?Past Medical History:  ?Diagnosis Date  ? BMI 33.0-33.9,adult 10/14/2021  ? Calculus of gallbladder with chronic cholecystitis without obstruction 10/14/2021  ? Diabetes mellitus (HCC) 11/26/2021  ? GERD (gastroesophageal reflux disease)   ? Hypertension   ? Memory loss   ? RBBB (right bundle branch block)   ? Swelling   ? Urticaria 12/03/2012  ? ? ?Past Surgical History:  ?Procedure Laterality Date  ? cataract surgery    ? CHOLECYSTECTOMY  10/16/2021  ? Dr. Georgiana Shore  ? TUBAL LIGATION    ? ? ?Current Medications: ?Current Meds  ?Medication Sig  ? amLODipine (NORVASC) 5 MG tablet Take 5 mg by mouth daily.  ? Cholecalciferol (VITAMIN D) 125 MCG (5000 UT) CAPS Take 5,000 Units by mouth daily.  ? famotidine (PEPCID) 20 MG tablet Take 20 mg by mouth daily.  ? metoprolol succinate (TOPROL-XL) 25 MG 24 hr tablet Take 25 mg by mouth daily.  ? Omega-3 Fatty Acids (FISH OIL PO) Take 1 tablet by mouth daily.  ? pioglitazone (ACTOS) 30 MG tablet Take 30 mg by mouth daily.  ? pravastatin (PRAVACHOL) 40 MG tablet  Take 40 mg by mouth daily.  ? sitaGLIPtin (JANUVIA) 100 MG tablet Take 100 mg by mouth daily.  ? telmisartan (MICARDIS) 80 MG tablet Take 80 mg by mouth daily.  ?  ? ?Allergies:   Ace inhibitors, Lisinopril, and Penicillins  ? ?Social History  ? ?Socioeconomic History  ? Marital status: Married  ?  Spouse name: Not on file  ? Number of children: Not on file  ? Years of education: Not on file  ? Highest education level: Not on file  ?Occupational History  ? Not on file  ?Tobacco Use  ? Smoking status: Former  ?  Packs/day: 0.50  ?  Years: 38.00  ?   Pack years: 19.00  ?  Types: Cigarettes  ?  Quit date: 01/1999  ?  Years since quitting: 22.9  ? Smokeless tobacco: Not on file  ?Substance and Sexual Activity  ? Alcohol use: No  ? Drug use: Not on file  ? Sexual activity: Not on file  ?Other Topics Concern  ? Not on file  ?Social History Narrative  ? Not on file  ? ?Social Determinants of Health  ? ?Financial Resource Strain: Not on file  ?Food Insecurity: Not on file  ?Transportation Needs: Not on file  ?Physical Activity: Not on file  ?Stress: Not on file  ?Social Connections: Not on file  ?  ? ?Family History: ?The patient's family history includes Emphysema in her mother; Heart attack in her father; Hypertension in her mother; Stroke in her father; Sudden death in her mother. ? ?ROS:   ?Please see the history of present illness.    ?All other systems reviewed and are negative. ? ?EKGs/Labs/Other Studies Reviewed:   ? ?The following studies were reviewed today: ?EKG reveals sinus rhythm and nonspecific ST-T changes ? ? ?Recent Labs: ?No results found for requested labs within last 8760 hours.  ?Recent Lipid Panel ?No results found for: CHOL, TRIG, HDL, CHOLHDL, VLDL, LDLCALC, LDLDIRECT ? ?Physical Exam:   ? ?VS:  BP (!) 207/72   Pulse 71   Ht 5' 3.6" (1.615 m)   Wt 189 lb (85.7 kg)   SpO2 95%   BMI 32.85 kg/m?    ? ?Wt Readings from Last 3 Encounters:  ?11/27/21 189 lb (85.7 kg)  ?11/22/21 189 lb 6.4 oz (85.9 kg)  ?12/25/10 170 lb (77.1 kg)  ?  ? ?GEN: Patient is in no acute distress ?HEENT: Normal ?NECK: No JVD; No carotid bruits ?LYMPHATICS: No lymphadenopathy ?CARDIAC: S1 S2 regular, 2/6 systolic murmur at the apex. ?RESPIRATORY:  Clear to auscultation without rales, wheezing or rhonchi  ?ABDOMEN: Soft, non-tender, non-distended ?MUSCULOSKELETAL:  No edema; No deformity  ?SKIN: Warm and dry ?NEUROLOGIC:  Alert and oriented x 3 ?PSYCHIATRIC:  Normal affect  ? ? ?Signed, ?Garwin Brothers, MD  ?11/27/2021 2:08 PM    ?[facility]   ?

## 2021-11-27 NOTE — Patient Instructions (Signed)
Medication Instructions:  ?Your physician recommends that you continue on your current medications as directed. Please refer to the Current Medication list given to you today. ? ?*If you need a refill on your cardiac medications before your next appointment, please call your pharmacy* ? ? ?Lab Work: ?None ordered ?If you have labs (blood work) drawn today and your tests are completely normal, you will receive your results only by: ?MyChart Message (if you have MyChart) OR ?A paper copy in the mail ?If you have any lab test that is abnormal or we need to change your treatment, we will call you to review the results. ? ? ?Testing/Procedures: ?Your physician has requested that you have a lexiscan myoview. For further information please visit [email or web address]. Please follow instruction sheet, as given. ? ?The test will take approximately 3 to 4 hours to complete; you may bring reading material.  If someone comes with you to your appointment, they will need to remain in the main lobby due to limited space in the testing area. **If you are pregnant or breastfeeding, please notify the nuclear lab prior to your appointment** ? ?How to prepare for your Myocardial Perfusion Test: ?Do not eat or drink 3 hours prior to your test, except you may have water. ?Do not consume products containing caffeine (regular or decaffeinated) 12 hours prior to your test. (ex: coffee, chocolate, sodas, tea). ?Do bring a list of your current medications with you.  If not listed below, you may take your medications as normal. ?Do not take metoprolol (Lopressor, Toprol) for 24 hours prior to the test.  Bring the medication to your appointment as you may be required to take it once the test is complete. ?Do wear comfortable clothes (no dresses or overalls) and walking shoes, tennis shoes preferred (No heels or open toe shoes are allowed). ?Do NOT wear cologne, perfume, aftershave, or lotions (deodorant is allowed). ?If these instructions are not  followed, your test will have to be rescheduled. ? ?Your physician has requested that you have an echocardiogram. Echocardiography is a painless test that uses sound waves to create images of your heart. It provides your doctor with information about the size and shape of your heart and how well your heart?s chambers and valves are working. This procedure takes approximately one hour. There are no restrictions for this procedure. ? ? ?Follow-Up: ?At [facility], you and your health needs are our priority.  As part of our continuing mission to provide you with exceptional heart care, we have created designated Provider Care Teams.  These Care Teams include your primary Cardiologist (physician) and Advanced Practice Providers (APPs -  Physician Assistants and Nurse Practitioners) who all work together to provide you with the care you need, when you need it. ? ?We recommend signing up for the patient portal called "MyChart".  Sign up information is provided on this After Visit Summary.  MyChart is used to connect with patients for Virtual Visits (Telemedicine).  Patients are able to view lab/test results, encounter notes, upcoming appointments, etc.  Non-urgent messages can be sent to your provider as well.   ?To learn more about what you can do with MyChart, go to [email or web address].   ? ?Your next appointment:   ?6 month(s) ? ?The format for your next appointment:   ?In Person ? ?Provider:   ?Belva Cromeajan Revankar, MD ? ? ?Other Instructions ?Cardiac Nuclear Scan ?A cardiac nuclear scan is a test that is done to check the flow of blood to your heart. It  is done when you are resting and when you are exercising. The test looks for problems such as: ?Not enough blood reaching a portion of the heart. ?The heart muscle not working as it should. ?You may need this test if: ?You have heart disease. ?You have had lab results that are not normal. ?You have had heart surgery or a balloon procedure to open up blocked arteries  (angioplasty). ?You have chest pain. ?You have shortness of breath. ?In this test, a special dye (tracer) is put into your bloodstream. The tracer will travel to your heart. A camera will then take pictures of your heart to see how the tracer moves through your heart. This test is usually done at a hospital and takes 2-4 hours. ?Tell a doctor about: ?Any allergies you have. ?All medicines you are taking, including vitamins, herbs, eye drops, creams, and over-the-counter medicines. ?Any problems you or family members have had with anesthetic medicines. ?Any blood disorders you have. ?Any surgeries you have had. ?Any medical conditions you have. ?Whether you are pregnant or may be pregnant. ?What are the risks? ?Generally, this is a safe test. However, problems may occur, such as: ?Serious chest pain and heart attack. This is only a risk if the stress portion of the test is done. ?Rapid heartbeat. ?A feeling of warmth in your chest. This feeling usually does not last long. ?Allergic reaction to the tracer. ?What happens before the test? ?Ask your doctor about changing or stopping your normal medicines. This is important. ?Follow instructions from your doctor about what you cannot eat or drink. ?Remove your jewelry on the day of the test. ?What happens during the test? ?An IV tube will be inserted into one of your veins. ?Your doctor will give you a small amount of tracer through the IV tube. ?You will wait for 20-40 minutes while the tracer moves through your bloodstream. ?Your heart will be monitored with an electrocardiogram (ECG). ?You will lie down on an exam table. ?Pictures of your heart will be taken for about 15-20 minutes. ?You may also have a stress test. For this test, one of these things may be done: ?You will be asked to exercise on a treadmill or a stationary bike. ?You will be given medicines that will make your heart work harder. This is done if you are unable to exercise. ?When blood flow to your  heart has peaked, a tracer will again be given through the IV tube. ?After 20-40 minutes, you will get back on the exam table. More pictures will be taken of your heart. ?Depending on the tracer that is used, more pictures may need to be taken 3-4 hours later. ?Your IV tube will be removed when the test is over. ?The test may vary among doctors and hospitals. ?What happens after the test? ?Ask your doctor: ?Whether you can return to your normal schedule, including diet, activities, and medicines. ?Whether you should drink more fluids. This will help to remove the tracer from your body. Drink enough fluid to keep your pee (urine) pale yellow. ?Ask your doctor, or the department that is doing the test: ?When will my results be ready? ?How will I get my results? ?Summary ?A cardiac nuclear scan is a test that is done to check the flow of blood to your heart. ?Tell your doctor whether you are pregnant or may be pregnant. ?Before the test, ask your doctor about changing or stopping your normal medicines. This is important. ?Ask your doctor whether you can return  to your normal activities. You may be asked to drink more fluids. ?This information is not intended to replace advice given to you by your health care provider. Make sure you discuss any questions you have with your health care provider. ?Document Revised: 11/10/2018 Document Reviewed: 01/04/2018 ?Elsevier Patient Education ? 2021 Elsevier Inc. ? ? ? ?Echocardiogram ?An echocardiogram is a test that uses sound waves (ultrasound) to produce images of the heart. ?Images from an echocardiogram can provide important information about: ?Heart size and shape. ?The size and thickness and movement of your heart's walls. ?Heart muscle function and strength. ?Heart valve function or if you have stenosis. Stenosis is when the heart valves are too narrow. ?If blood is flowing backward through the heart valves (regurgitation). ?A tumor or infectious growth around the heart  valves. ?Areas of heart muscle that are not working well because of poor blood flow or injury from a heart attack. ?Aneurysm detection. An aneurysm is a weak or damaged part of an artery wall. The wall bul

## 2021-12-04 ENCOUNTER — Telehealth (HOSPITAL_COMMUNITY): Payer: Self-pay | Admitting: *Deleted

## 2021-12-04 NOTE — Telephone Encounter (Signed)
Patient given detailed instructions per Myocardial Perfusion Study Information Sheet for the test on 12/11/2021 ? at 8:00. Patient notified to arrive 15 minutes early and that it is imperative to arrive on time for appointment to keep from having the test rescheduled. ? If you need to cancel or reschedule your appointment, please call the office within 24 hours of your appointment. . Patient verbalized understanding.Nelson Chimes S ? ? ?

## 2021-12-11 ENCOUNTER — Ambulatory Visit (INDEPENDENT_AMBULATORY_CARE_PROVIDER_SITE_OTHER): Payer: Medicare HMO

## 2021-12-11 DIAGNOSIS — R079 Chest pain, unspecified: Secondary | ICD-10-CM | POA: Diagnosis not present

## 2021-12-11 LAB — MYOCARDIAL PERFUSION IMAGING
Angina Index: 0
Duke Treadmill Score: 3
Estimated workload: 4.6
Exercise duration (min): 3 min
Exercise duration (sec): 22 s
LV dias vol: 60 mL (ref 46–106)
LV sys vol: 14 mL
MPHR: 144 {beats}/min
Nuc Stress EF: 77 %
Peak HR: 134 {beats}/min
Percent HR: 93 %
Rest HR: 85 {beats}/min
Rest Nuclear Isotope Dose: 10.9 mCi
SDS: 2
SRS: 2
SSS: 4
ST Depression (mm): 0 mm
Stress Nuclear Isotope Dose: 29.9 mCi
TID: 0.95

## 2021-12-11 LAB — ECHOCARDIOGRAM COMPLETE
Area-P 1/2: 5.38 cm2
Height: 63 in
MV M vel: 6.22 m/s
MV Peak grad: 154.8 mmHg
S' Lateral: 2.6 cm
Weight: 3024 oz

## 2021-12-11 MED ORDER — TECHNETIUM TC 99M TETROFOSMIN IV KIT
10.9000 | PACK | Freq: Once | INTRAVENOUS | Status: AC | PRN
  Administered 2021-12-11: 10.9 via INTRAVENOUS

## 2021-12-11 MED ORDER — TECHNETIUM TC 99M TETROFOSMIN IV KIT
29.9000 | PACK | Freq: Once | INTRAVENOUS | Status: AC | PRN
  Administered 2021-12-11: 29.9 via INTRAVENOUS

## 2021-12-27 DIAGNOSIS — E118 Type 2 diabetes mellitus with unspecified complications: Secondary | ICD-10-CM | POA: Diagnosis not present

## 2022-01-02 DIAGNOSIS — H524 Presbyopia: Secondary | ICD-10-CM | POA: Diagnosis not present

## 2022-01-02 DIAGNOSIS — E119 Type 2 diabetes mellitus without complications: Secondary | ICD-10-CM | POA: Diagnosis not present

## 2022-01-02 DIAGNOSIS — Z7984 Long term (current) use of oral hypoglycemic drugs: Secondary | ICD-10-CM | POA: Diagnosis not present

## 2022-01-03 DIAGNOSIS — D692 Other nonthrombocytopenic purpura: Secondary | ICD-10-CM | POA: Diagnosis not present

## 2022-01-03 DIAGNOSIS — M542 Cervicalgia: Secondary | ICD-10-CM | POA: Diagnosis not present

## 2022-01-03 DIAGNOSIS — N183 Chronic kidney disease, stage 3 unspecified: Secondary | ICD-10-CM | POA: Diagnosis not present

## 2022-01-03 DIAGNOSIS — Z1331 Encounter for screening for depression: Secondary | ICD-10-CM | POA: Diagnosis not present

## 2022-01-03 DIAGNOSIS — Z6832 Body mass index (BMI) 32.0-32.9, adult: Secondary | ICD-10-CM | POA: Diagnosis not present

## 2022-01-03 DIAGNOSIS — E118 Type 2 diabetes mellitus with unspecified complications: Secondary | ICD-10-CM | POA: Diagnosis not present

## 2022-01-03 DIAGNOSIS — I4729 Other ventricular tachycardia: Secondary | ICD-10-CM | POA: Diagnosis not present

## 2022-04-03 DIAGNOSIS — I10 Essential (primary) hypertension: Secondary | ICD-10-CM | POA: Diagnosis not present

## 2022-04-03 DIAGNOSIS — I4729 Other ventricular tachycardia: Secondary | ICD-10-CM | POA: Diagnosis not present

## 2022-04-03 DIAGNOSIS — E1122 Type 2 diabetes mellitus with diabetic chronic kidney disease: Secondary | ICD-10-CM | POA: Diagnosis not present

## 2022-04-03 DIAGNOSIS — D692 Other nonthrombocytopenic purpura: Secondary | ICD-10-CM | POA: Diagnosis not present

## 2022-04-03 DIAGNOSIS — Z0000 Encounter for general adult medical examination without abnormal findings: Secondary | ICD-10-CM | POA: Diagnosis not present

## 2022-04-03 DIAGNOSIS — E118 Type 2 diabetes mellitus with unspecified complications: Secondary | ICD-10-CM | POA: Diagnosis not present

## 2022-04-03 DIAGNOSIS — Z6832 Body mass index (BMI) 32.0-32.9, adult: Secondary | ICD-10-CM | POA: Diagnosis not present

## 2022-04-03 DIAGNOSIS — N183 Chronic kidney disease, stage 3 unspecified: Secondary | ICD-10-CM | POA: Diagnosis not present

## 2022-05-09 DIAGNOSIS — Z23 Encounter for immunization: Secondary | ICD-10-CM | POA: Diagnosis not present

## 2022-07-04 DIAGNOSIS — Z1231 Encounter for screening mammogram for malignant neoplasm of breast: Secondary | ICD-10-CM | POA: Diagnosis not present

## 2022-07-16 DIAGNOSIS — N10 Acute tubulo-interstitial nephritis: Secondary | ICD-10-CM | POA: Diagnosis not present

## 2022-07-16 DIAGNOSIS — R350 Frequency of micturition: Secondary | ICD-10-CM | POA: Diagnosis not present

## 2022-07-16 DIAGNOSIS — R509 Fever, unspecified: Secondary | ICD-10-CM | POA: Diagnosis not present

## 2022-07-16 DIAGNOSIS — R300 Dysuria: Secondary | ICD-10-CM | POA: Diagnosis not present

## 2022-09-26 DIAGNOSIS — E118 Type 2 diabetes mellitus with unspecified complications: Secondary | ICD-10-CM | POA: Diagnosis not present

## 2022-09-26 DIAGNOSIS — E785 Hyperlipidemia, unspecified: Secondary | ICD-10-CM | POA: Diagnosis not present

## 2022-10-02 DIAGNOSIS — I4729 Other ventricular tachycardia: Secondary | ICD-10-CM | POA: Diagnosis not present

## 2022-10-02 DIAGNOSIS — R350 Frequency of micturition: Secondary | ICD-10-CM | POA: Diagnosis not present

## 2022-10-02 DIAGNOSIS — E785 Hyperlipidemia, unspecified: Secondary | ICD-10-CM | POA: Diagnosis not present

## 2022-10-02 DIAGNOSIS — N183 Chronic kidney disease, stage 3 unspecified: Secondary | ICD-10-CM | POA: Diagnosis not present

## 2022-10-02 DIAGNOSIS — D692 Other nonthrombocytopenic purpura: Secondary | ICD-10-CM | POA: Diagnosis not present

## 2022-10-02 DIAGNOSIS — Z6832 Body mass index (BMI) 32.0-32.9, adult: Secondary | ICD-10-CM | POA: Diagnosis not present

## 2022-10-02 DIAGNOSIS — E1169 Type 2 diabetes mellitus with other specified complication: Secondary | ICD-10-CM | POA: Diagnosis not present

## 2022-10-02 DIAGNOSIS — H65192 Other acute nonsuppurative otitis media, left ear: Secondary | ICD-10-CM | POA: Diagnosis not present

## 2022-10-02 DIAGNOSIS — I10 Essential (primary) hypertension: Secondary | ICD-10-CM | POA: Diagnosis not present

## 2022-10-02 LAB — LAB REPORT - SCANNED
A1c: 6.3
EGFR: 60

## 2022-12-10 ENCOUNTER — Encounter: Payer: Self-pay | Admitting: Cardiology

## 2022-12-10 ENCOUNTER — Ambulatory Visit: Payer: Medicare HMO | Attending: Cardiology | Admitting: Cardiology

## 2022-12-10 VITALS — BP 136/88 | HR 63 | Ht 63.0 in | Wt 186.2 lb

## 2022-12-10 DIAGNOSIS — I10 Essential (primary) hypertension: Secondary | ICD-10-CM

## 2022-12-10 DIAGNOSIS — E088 Diabetes mellitus due to underlying condition with unspecified complications: Secondary | ICD-10-CM | POA: Diagnosis not present

## 2022-12-10 DIAGNOSIS — E669 Obesity, unspecified: Secondary | ICD-10-CM | POA: Diagnosis not present

## 2022-12-10 NOTE — Patient Instructions (Signed)
Medication Instructions:  Your physician recommends that you continue on your current medications as directed. Please refer to the Current Medication list given to you today.  *If you need a refill on your cardiac medications before your next appointment, please call your pharmacy*   Lab Work: None ordered If you have labs (blood work) drawn today and your tests are completely normal, you will receive your results only by: MyChart Message (if you have MyChart) OR A paper copy in the mail If you have any lab test that is abnormal or we need to change your treatment, we will call you to review the results.   Testing/Procedures: None ordered   Follow-Up: At [facility], you and your health needs are our priority.  As part of our continuing mission to provide you with exceptional heart care, we have created designated Provider Care Teams.  These Care Teams include your primary Cardiologist (physician) and Advanced Practice Providers (APPs -  Physician Assistants and Nurse Practitioners) who all work together to provide you with the care you need, when you need it.  We recommend signing up for the patient portal called "MyChart".  Sign up information is provided on this After Visit Summary.  MyChart is used to connect with patients for Virtual Visits (Telemedicine).  Patients are able to view lab/test results, encounter notes, upcoming appointments, etc.  Non-urgent messages can be sent to your provider as well.   To learn more about what you can do with MyChart, go to [email or web address].    Your next appointment:   As needed  The format for your next appointment:   In Person  Provider:   Rajan Revankar, MD    Other Instructions none  Important Information About Sugar       

## 2022-12-10 NOTE — Progress Notes (Signed)
Cardiology Office Note:    Date:  12/10/2022   ID:  Jacqueline Austin, DOB [birth year], MRN [id number]  PCP:  Buckner Malta, MD  Cardiologist:  Garwin Brothers, MD   Referring MD: Buckner Malta, MD    ASSESSMENT:    1. Primary hypertension   2. Diabetes mellitus due to underlying condition with unspecified complications (HCC)   3. Obesity (BMI 30.0-34.9)    PLAN:    In order of problems listed above:  Primary prevention stressed with the patient.  Importance of compliance with diet medication stressed and patient verbalized standing. Chest pain: This has resolved and she is happy about it.  She leads a sedentary lifestyle and had a long discussion with her about the risks of sedentary lifestyle and she promises to do better. Mixed dyslipidemia: Lipids are not at goal.  In view of diabetes.  Goal LDL must be less than 60 and this will be followed by primary care.  Diet emphasized. Diabetes mellitus and obesity: She promises to do better with diet and exercise.  She is still overweight.  Lifestyle modification urged. Essential hypertension: Blood pressure stable.  She makes sure she checks these at home and these numbers are better than office blood pressure readings.  Salt intake and diet issues emphasized. Patient will be seen in follow-up appointment in 12 months or earlier if the patient has any concerns.    Medication Adjustments/Labs and Tests Ordered: Current medicines are reviewed at length with the patient today.  Concerns regarding medicines are outlined above.  No orders of the defined types were placed in this encounter.  No orders of the defined types were placed in this encounter.    No chief complaint on file.    History of Present Illness:    Jacqueline Austin is a 77 y.o. female.  Patient has past medical history of essential hypertension, mixed dyslipidemia, diabetes mellitus.  She leads a sedentary lifestyle.  She was evaluated for chest pain and her  testing echo and stress test are negative.  At the time of my evaluation, the patient is alert awake oriented and in no distress.  Past Medical History:  Diagnosis Date   BMI 33.0-33.9,adult 10/14/2021   Calculus of gallbladder with chronic cholecystitis without obstruction 10/14/2021   Chest pain of uncertain etiology 11/27/2021   Diabetes mellitus (HCC) 11/26/2021   Diabetes mellitus due to underlying condition with unspecified complications (HCC) 11/27/2021   GERD (gastroesophageal reflux disease)    Hypertension    Memory loss    Obesity (BMI 30.0-34.9) 11/27/2021   RBBB (right bundle branch block)    Swelling    Urticaria 12/03/2012    Past Surgical History:  Procedure Laterality Date   cataract surgery     CHOLECYSTECTOMY  10/16/2021   Dr. Georgiana Shore   TUBAL LIGATION      Current Medications: Current Meds  Medication Sig   amLODipine (NORVASC) 5 MG tablet Take 5 mg by mouth daily.   famotidine (PEPCID) 20 MG tablet Take 20 mg by mouth daily.   metoprolol succinate (TOPROL-XL) 25 MG 24 hr tablet Take 25 mg by mouth daily.   pioglitazone (ACTOS) 30 MG tablet Take 30 mg by mouth daily.   pravastatin (PRAVACHOL) 40 MG tablet Take 40 mg by mouth daily.   sitaGLIPtin (JANUVIA) 100 MG tablet Take 100 mg by mouth daily.   telmisartan (MICARDIS) 80 MG tablet Take 80 mg by mouth daily.     Allergies:   Ace inhibitors, Lisinopril, and Penicillins  Social History   Socioeconomic History   Marital status: Married    Spouse name: Not on file   Number of children: Not on file   Years of education: Not on file   Highest education level: Not on file  Occupational History   Not on file  Tobacco Use   Smoking status: Former    Packs/day: 0.50    Years: 38.00    Additional pack years: 0.00    Total pack years: 19.00    Types: Cigarettes    Quit date: 01/1999    Years since quitting: 23.9   Smokeless tobacco: Not on file  Substance and Sexual Activity   Alcohol use: No    Drug use: Not on file   Sexual activity: Not on file  Other Topics Concern   Not on file  Social History Narrative   Not on file   Social Determinants of Health   Financial Resource Strain: Not on file  Food Insecurity: Not on file  Transportation Needs: Not on file  Physical Activity: Not on file  Stress: Not on file  Social Connections: Not on file     Family History: The patient's family history includes Emphysema in her mother; Heart attack in her father; Hypertension in her mother; Stroke in her father; Sudden death in her mother.  ROS:   Please see the history of present illness.    All other systems reviewed and are negative.  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  Recent Lipid Panel No results found for: "CHOL", "TRIG", "HDL", "CHOLHDL", "VLDL", "LDLCALC", "LDLDIRECT"  Physical Exam:    VS:  BP 136/88   Pulse 63   Ht 5\' 3"  (1.6 m)   Wt 186 lb 3.2 oz (84.5 kg)   SpO2 93%   BMI 32.98 kg/m     Wt Readings from Last 3 Encounters:  12/10/22 186 lb 3.2 oz (84.5 kg)  12/11/21 189 lb (85.7 kg)  11/27/21 189 lb (85.7 kg)     GEN: Patient is in no acute distress HEENT: Normal NECK: No JVD; No carotid bruits LYMPHATICS: No lymphadenopathy CARDIAC: Hear sounds regular, 2/6 systolic murmur at the apex. RESPIRATORY:  Clear to auscultation without rales, wheezing or rhonchi  ABDOMEN: Soft, non-tender, non-distended MUSCULOSKELETAL:  No edema; No deformity  SKIN: Warm and dry NEUROLOGIC:  Alert and oriented x 3 PSYCHIATRIC:  Normal affect   Signed, Garwin Brothers, MD  12/10/2022 9:11 AM    [facility]

## 2023-02-14 IMAGING — US US ABDOMEN LIMITED
1 series · 14 of 25 positions shown · non-contrast
Comparison: None.

CLINICAL DATA: Right upper quadrant pain for 3 days.

EXAM:
ULTRASOUND ABDOMEN LIMITED RIGHT UPPER QUADRANT

[Series 1: us abdomen limited ruq (liver/gb) · 14 of 48 slices shown]
[im 1/48]
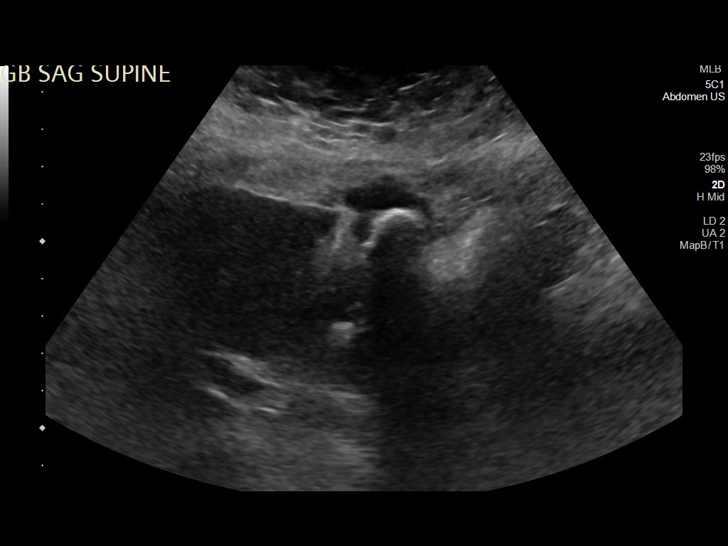
[im 4/48]
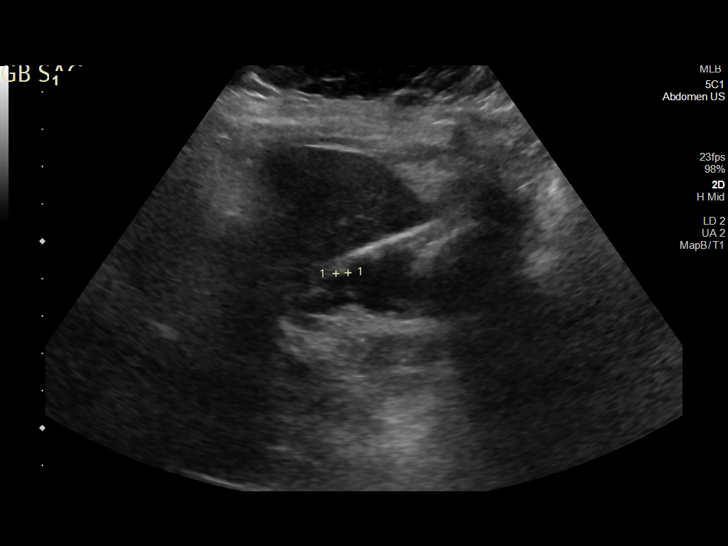
[im 8/48]
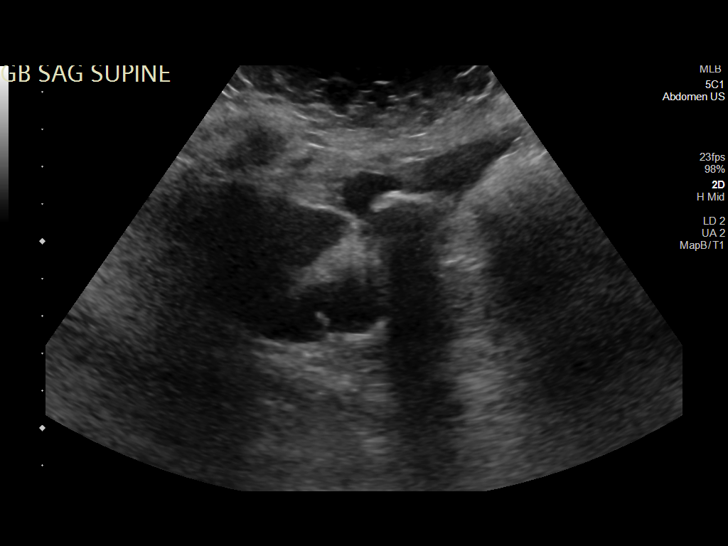
[im 12/48]
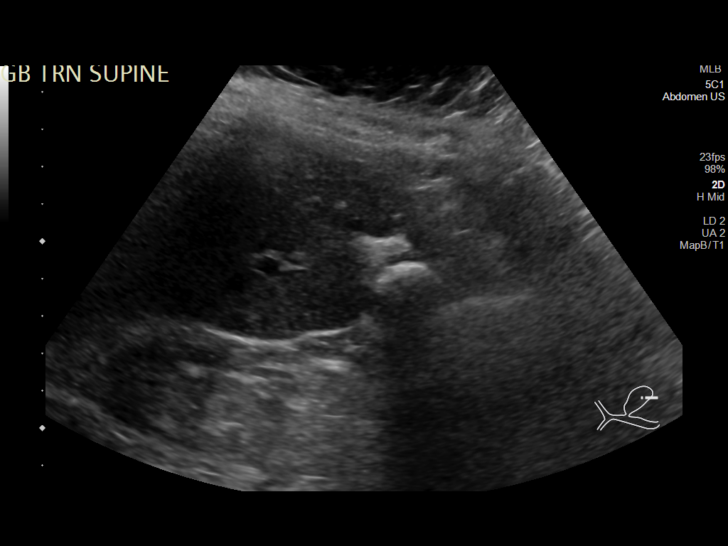
[im 16/48]
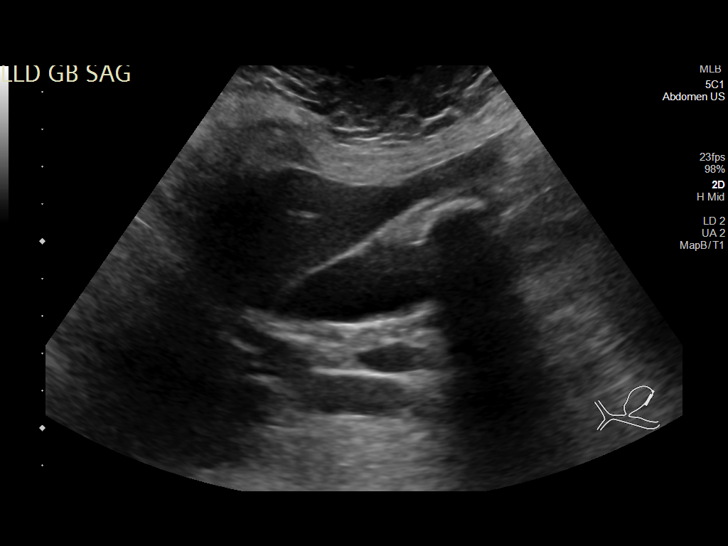
[im 18/48]
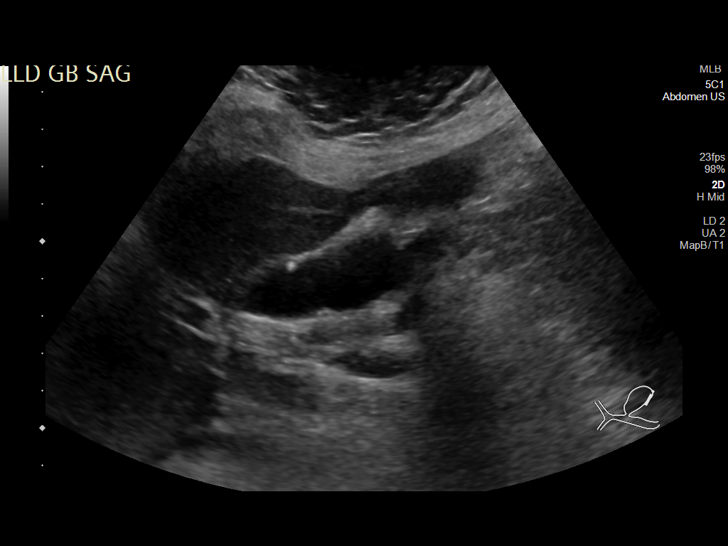
[im 22/48]
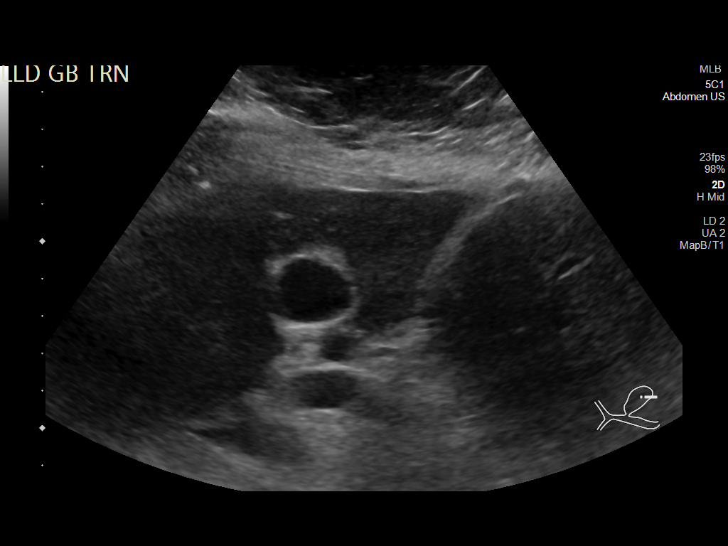
[im 26/48]
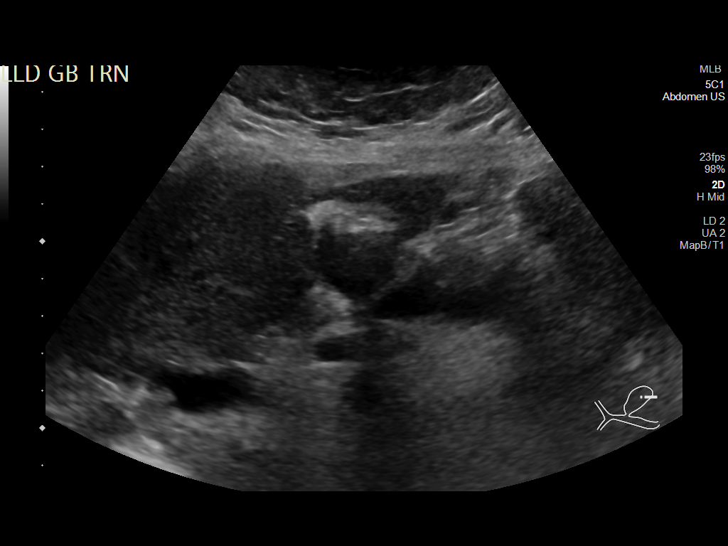
[im 30/48]
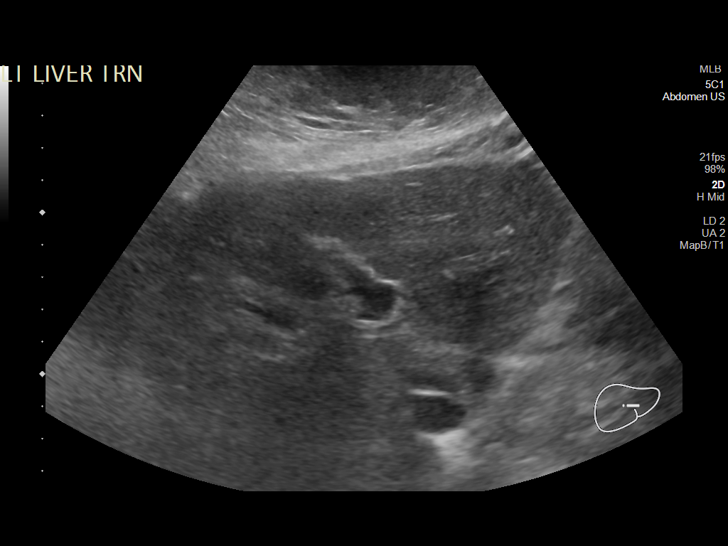
[im 32/48]
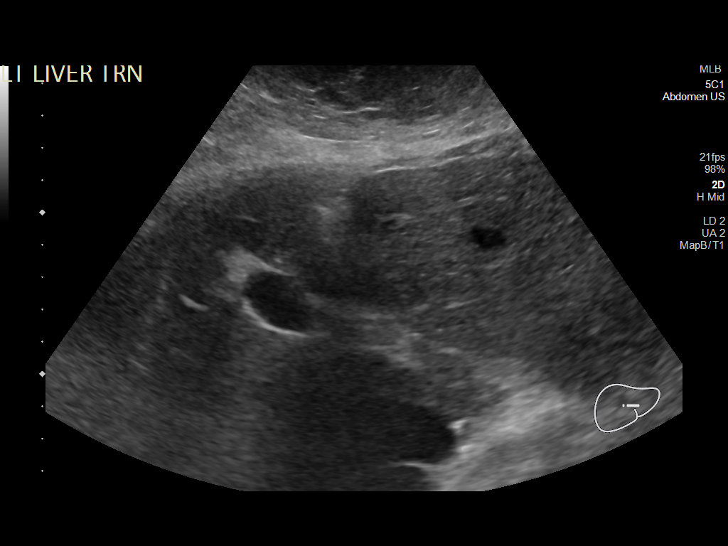
[im 36/48]
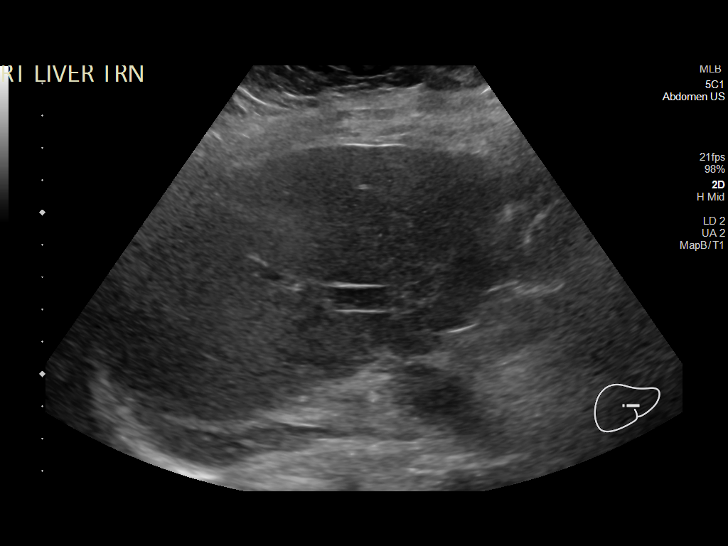
[im 40/48]
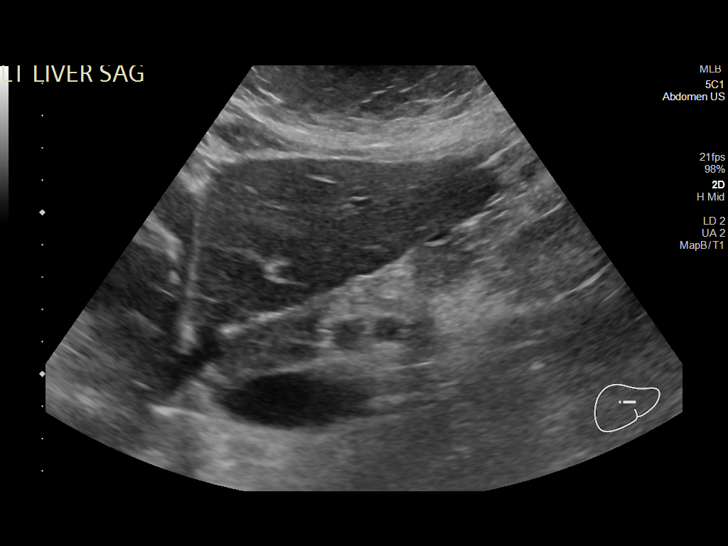
[im 44/48]
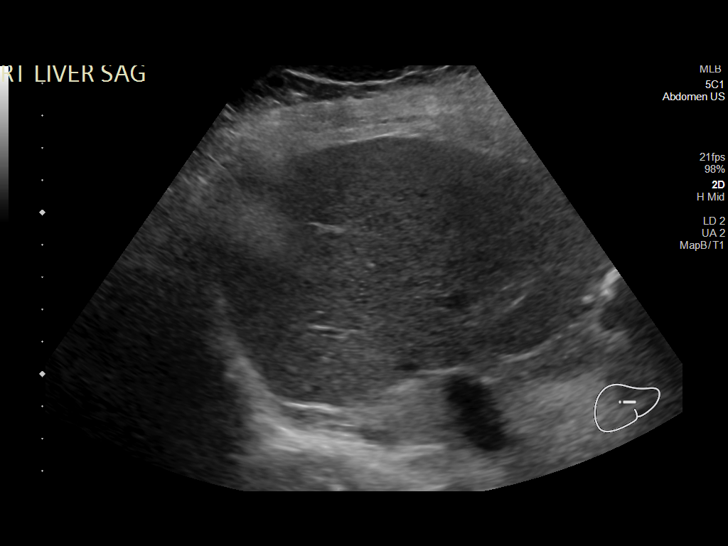
[im 48/48]
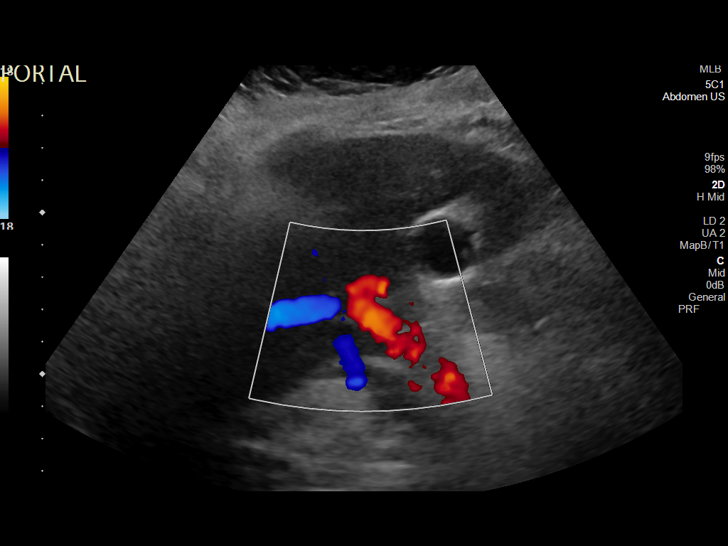

[14 of 25 positions shown; findings below may reference images not displayed]

FINDINGS: Gallbladder:

Gallstones are seen, largest measuring 1.8 cm. No evidence of
gallbladder dilatation or wall thickening. No sonographic Murphy
sign noted by sonographer.

Common bile duct:

Diameter: 8 mm, which is mildly dilated. No definite dilatation of
intrahepatic bile ducts is seen.

Liver:

No focal lesion identified. Within normal limits in parenchymal
echogenicity. Portal vein is patent on color Doppler imaging with
normal direction of blood flow towards the liver.

Other: None.
IMPRESSION: Cholelithiasis. No sonographic signs of cholecystitis.

Mild biliary ductal dilatation, with common bile duct measuring 8
mm. Suggest correlation with liver function tests, and consider
abdomen MRI and MRCP without and with contrast for further
evaluation.

## 2023-03-12 DIAGNOSIS — Z0100 Encounter for examination of eyes and vision without abnormal findings: Secondary | ICD-10-CM | POA: Diagnosis not present

## 2023-03-17 DIAGNOSIS — E113291 Type 2 diabetes mellitus with mild nonproliferative diabetic retinopathy without macular edema, right eye: Secondary | ICD-10-CM | POA: Diagnosis not present

## 2023-03-17 DIAGNOSIS — H524 Presbyopia: Secondary | ICD-10-CM | POA: Diagnosis not present

## 2023-03-17 DIAGNOSIS — Z7984 Long term (current) use of oral hypoglycemic drugs: Secondary | ICD-10-CM | POA: Diagnosis not present

## 2023-04-08 DIAGNOSIS — R0981 Nasal congestion: Secondary | ICD-10-CM | POA: Diagnosis not present

## 2023-04-08 DIAGNOSIS — Z1331 Encounter for screening for depression: Secondary | ICD-10-CM | POA: Diagnosis not present

## 2023-04-08 DIAGNOSIS — R0602 Shortness of breath: Secondary | ICD-10-CM | POA: Diagnosis not present

## 2023-04-08 DIAGNOSIS — Z1339 Encounter for screening examination for other mental health and behavioral disorders: Secondary | ICD-10-CM | POA: Diagnosis not present

## 2023-04-08 DIAGNOSIS — Z6832 Body mass index (BMI) 32.0-32.9, adult: Secondary | ICD-10-CM | POA: Diagnosis not present

## 2023-04-21 DIAGNOSIS — E785 Hyperlipidemia, unspecified: Secondary | ICD-10-CM | POA: Diagnosis not present

## 2023-04-21 DIAGNOSIS — Z0000 Encounter for general adult medical examination without abnormal findings: Secondary | ICD-10-CM | POA: Diagnosis not present

## 2023-04-21 DIAGNOSIS — D692 Other nonthrombocytopenic purpura: Secondary | ICD-10-CM | POA: Diagnosis not present

## 2023-04-21 DIAGNOSIS — E1122 Type 2 diabetes mellitus with diabetic chronic kidney disease: Secondary | ICD-10-CM | POA: Diagnosis not present

## 2023-04-21 DIAGNOSIS — I4729 Other ventricular tachycardia: Secondary | ICD-10-CM | POA: Diagnosis not present

## 2023-04-21 DIAGNOSIS — N183 Chronic kidney disease, stage 3 unspecified: Secondary | ICD-10-CM | POA: Diagnosis not present

## 2023-04-21 DIAGNOSIS — E1159 Type 2 diabetes mellitus with other circulatory complications: Secondary | ICD-10-CM | POA: Diagnosis not present

## 2023-04-21 DIAGNOSIS — N182 Chronic kidney disease, stage 2 (mild): Secondary | ICD-10-CM | POA: Diagnosis not present

## 2023-04-21 DIAGNOSIS — E1169 Type 2 diabetes mellitus with other specified complication: Secondary | ICD-10-CM | POA: Diagnosis not present

## 2023-04-21 DIAGNOSIS — E119 Type 2 diabetes mellitus without complications: Secondary | ICD-10-CM | POA: Diagnosis not present

## 2023-05-07 DIAGNOSIS — Z23 Encounter for immunization: Secondary | ICD-10-CM | POA: Diagnosis not present

## 2023-05-07 DIAGNOSIS — M8589 Other specified disorders of bone density and structure, multiple sites: Secondary | ICD-10-CM | POA: Diagnosis not present

## 2023-05-07 DIAGNOSIS — Z13820 Encounter for screening for osteoporosis: Secondary | ICD-10-CM | POA: Diagnosis not present

## 2023-05-25 DIAGNOSIS — M5432 Sciatica, left side: Secondary | ICD-10-CM | POA: Diagnosis not present

## 2023-05-25 DIAGNOSIS — Z6833 Body mass index (BMI) 33.0-33.9, adult: Secondary | ICD-10-CM | POA: Diagnosis not present

## 2023-07-15 DIAGNOSIS — I152 Hypertension secondary to endocrine disorders: Secondary | ICD-10-CM | POA: Diagnosis not present

## 2023-07-15 DIAGNOSIS — E785 Hyperlipidemia, unspecified: Secondary | ICD-10-CM | POA: Diagnosis not present

## 2023-07-15 DIAGNOSIS — E1159 Type 2 diabetes mellitus with other circulatory complications: Secondary | ICD-10-CM | POA: Diagnosis not present

## 2023-07-15 DIAGNOSIS — Z6833 Body mass index (BMI) 33.0-33.9, adult: Secondary | ICD-10-CM | POA: Diagnosis not present

## 2023-07-15 DIAGNOSIS — E1169 Type 2 diabetes mellitus with other specified complication: Secondary | ICD-10-CM | POA: Diagnosis not present

## 2023-07-15 DIAGNOSIS — I4729 Other ventricular tachycardia: Secondary | ICD-10-CM | POA: Diagnosis not present

## 2023-09-23 DIAGNOSIS — Z1231 Encounter for screening mammogram for malignant neoplasm of breast: Secondary | ICD-10-CM | POA: Diagnosis not present

## 2023-10-20 DIAGNOSIS — I4729 Other ventricular tachycardia: Secondary | ICD-10-CM | POA: Diagnosis not present

## 2023-10-20 DIAGNOSIS — D692 Other nonthrombocytopenic purpura: Secondary | ICD-10-CM | POA: Diagnosis not present

## 2023-10-20 DIAGNOSIS — E1122 Type 2 diabetes mellitus with diabetic chronic kidney disease: Secondary | ICD-10-CM | POA: Diagnosis not present

## 2023-10-20 DIAGNOSIS — E1159 Type 2 diabetes mellitus with other circulatory complications: Secondary | ICD-10-CM | POA: Diagnosis not present

## 2023-10-20 DIAGNOSIS — E119 Type 2 diabetes mellitus without complications: Secondary | ICD-10-CM | POA: Diagnosis not present

## 2023-10-20 DIAGNOSIS — I152 Hypertension secondary to endocrine disorders: Secondary | ICD-10-CM | POA: Diagnosis not present

## 2023-10-20 DIAGNOSIS — E1169 Type 2 diabetes mellitus with other specified complication: Secondary | ICD-10-CM | POA: Diagnosis not present

## 2023-10-20 DIAGNOSIS — E785 Hyperlipidemia, unspecified: Secondary | ICD-10-CM | POA: Diagnosis not present

## 2023-10-20 DIAGNOSIS — N182 Chronic kidney disease, stage 2 (mild): Secondary | ICD-10-CM | POA: Diagnosis not present

## 2023-11-02 DIAGNOSIS — E1121 Type 2 diabetes mellitus with diabetic nephropathy: Secondary | ICD-10-CM | POA: Diagnosis not present

## 2023-11-18 ENCOUNTER — Ambulatory Visit (INDEPENDENT_AMBULATORY_CARE_PROVIDER_SITE_OTHER): Admitting: Podiatry

## 2023-11-18 DIAGNOSIS — M79673 Pain in unspecified foot: Secondary | ICD-10-CM

## 2023-11-18 NOTE — Progress Notes (Signed)
 Patient arrived for her new patient appointment regarding calluses.  She states that she was referred here by her PCP for the calluses.  She states that she is asymptomatic.  Due to billing guidelines, the patient was presented with a commercial ABN to review and signed today noting that the callus care may not be covered.  The patient did not want to sign it and possibly be responsible financially for the service today so the appointment was ended prior to the doctor entering the room and the patient was returned to her co-pay and she left the office.

## 2024-03-17 DIAGNOSIS — E113291 Type 2 diabetes mellitus with mild nonproliferative diabetic retinopathy without macular edema, right eye: Secondary | ICD-10-CM | POA: Diagnosis not present

## 2024-04-22 DIAGNOSIS — Z1339 Encounter for screening examination for other mental health and behavioral disorders: Secondary | ICD-10-CM | POA: Diagnosis not present

## 2024-04-22 DIAGNOSIS — E785 Hyperlipidemia, unspecified: Secondary | ICD-10-CM | POA: Diagnosis not present

## 2024-04-22 DIAGNOSIS — E1159 Type 2 diabetes mellitus with other circulatory complications: Secondary | ICD-10-CM | POA: Diagnosis not present

## 2024-04-22 DIAGNOSIS — E1121 Type 2 diabetes mellitus with diabetic nephropathy: Secondary | ICD-10-CM | POA: Diagnosis not present

## 2024-04-22 DIAGNOSIS — Z0000 Encounter for general adult medical examination without abnormal findings: Secondary | ICD-10-CM | POA: Diagnosis not present

## 2024-04-22 DIAGNOSIS — I152 Hypertension secondary to endocrine disorders: Secondary | ICD-10-CM | POA: Diagnosis not present

## 2024-04-22 DIAGNOSIS — Z6834 Body mass index (BMI) 34.0-34.9, adult: Secondary | ICD-10-CM | POA: Diagnosis not present

## 2024-04-22 DIAGNOSIS — E1169 Type 2 diabetes mellitus with other specified complication: Secondary | ICD-10-CM | POA: Diagnosis not present

## 2024-04-22 DIAGNOSIS — Z23 Encounter for immunization: Secondary | ICD-10-CM | POA: Diagnosis not present
